# Patient Record
Sex: Female | Born: 2000 | Race: Black or African American | Hispanic: No | Marital: Single
Health system: Midwestern US, Community
[De-identification: ages and names within clinical notes are randomized; demographics above are authoritative.]

## PROBLEM LIST (undated history)

## (undated) DIAGNOSIS — Z3041 Encounter for surveillance of contraceptive pills: Secondary | ICD-10-CM

## (undated) DIAGNOSIS — R0602 Shortness of breath: Secondary | ICD-10-CM

## (undated) DIAGNOSIS — Z01419 Encounter for gynecological examination (general) (routine) without abnormal findings: Principal | ICD-10-CM

## (undated) HISTORY — PX: WISDOM TOOTH EXTRACTION: SHX21

---
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
## 2021-04-17 IMAGING — CR DG THORACIC SPINE 2V
3 series · 3 of 3 positions shown · non-contrast
Comparison: None.

CLINICAL DATA: Mid back pain

EXAM:
THORACIC SPINE 2 VIEWS

[t-spine ap]
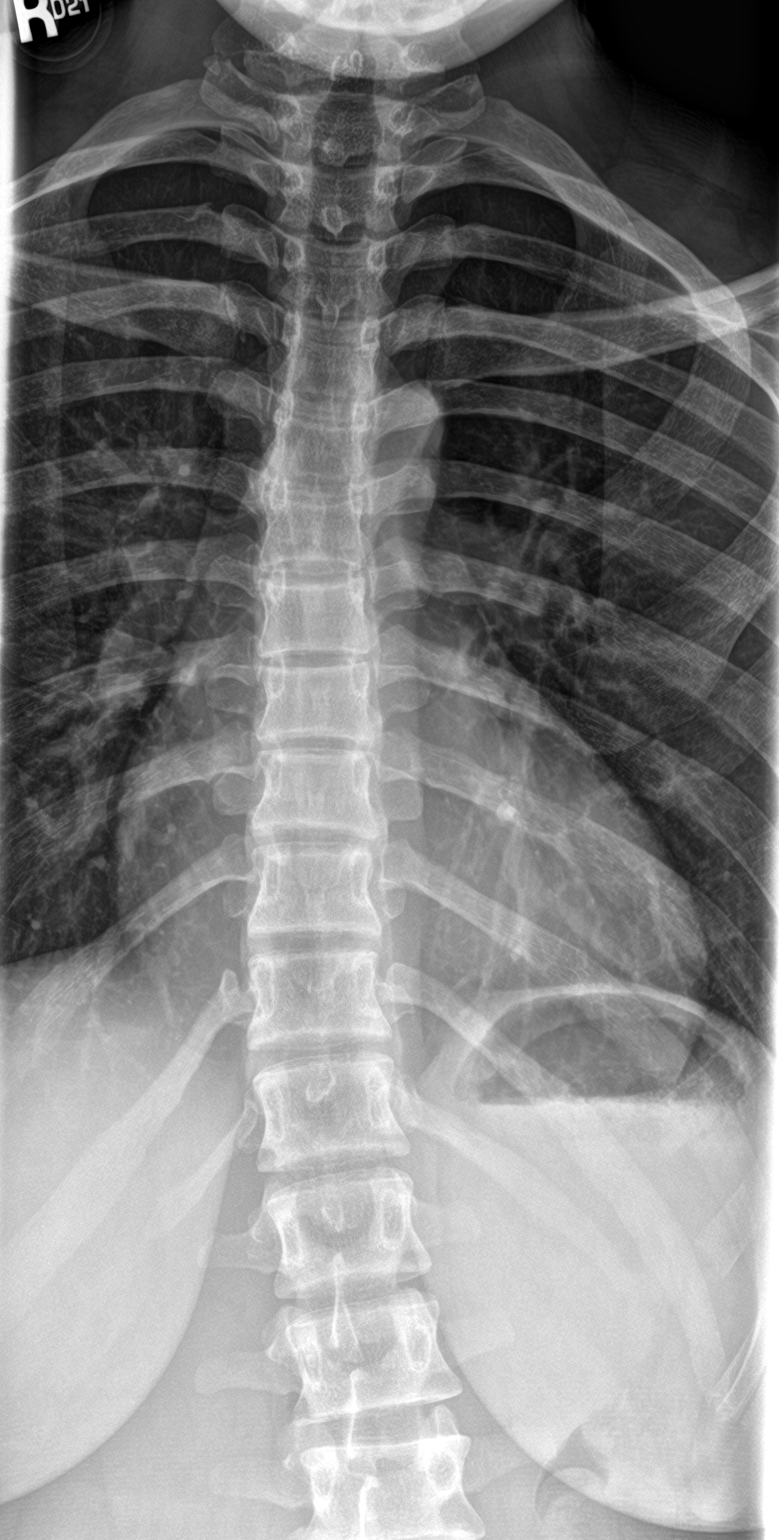

[t-spine lat]
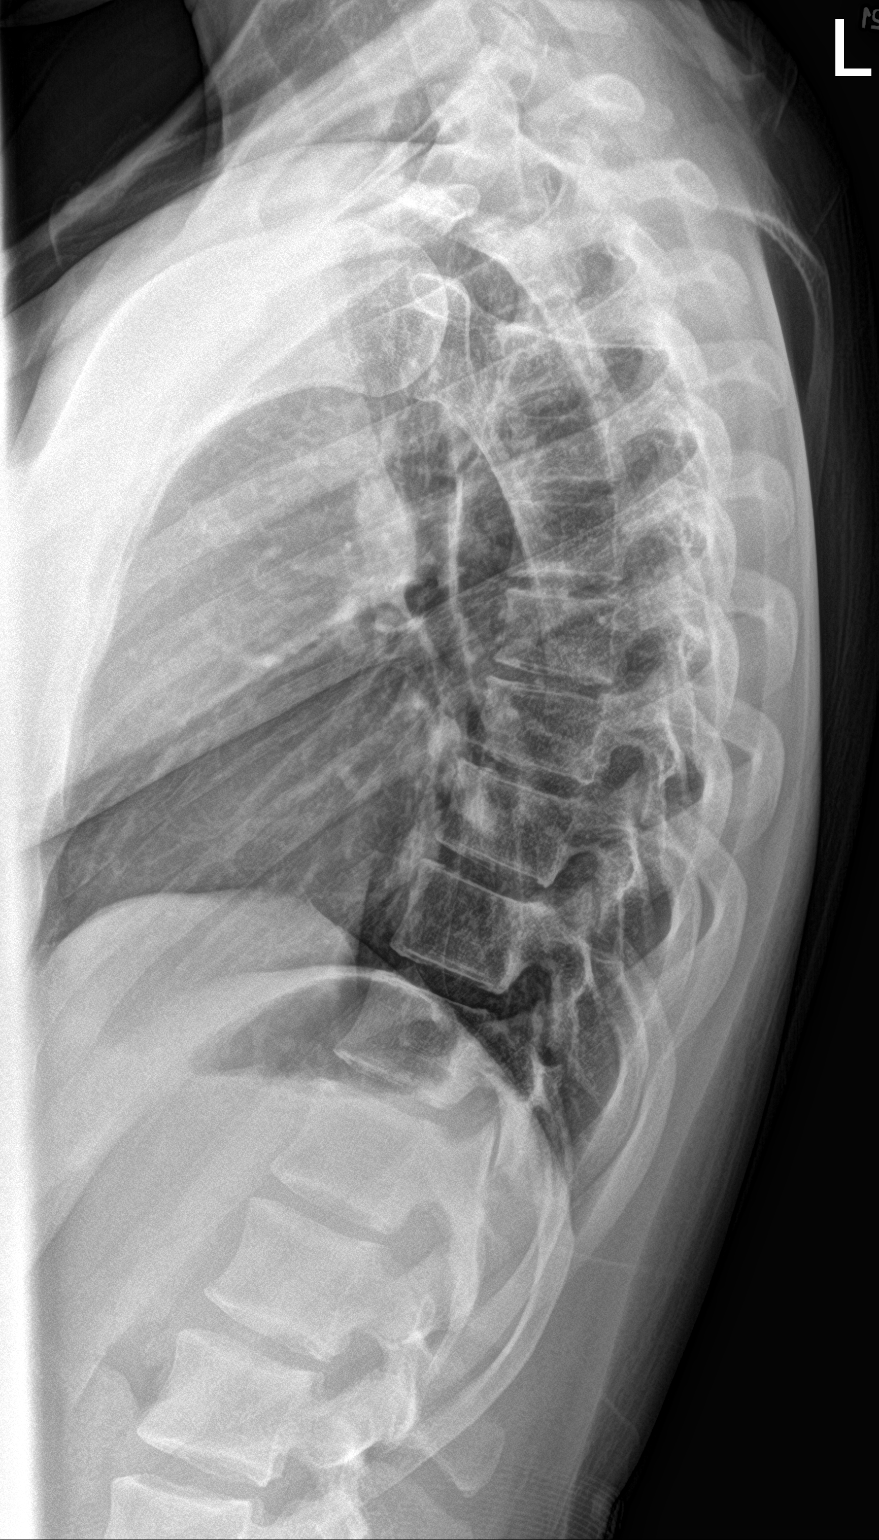

[t-spine swimmers]
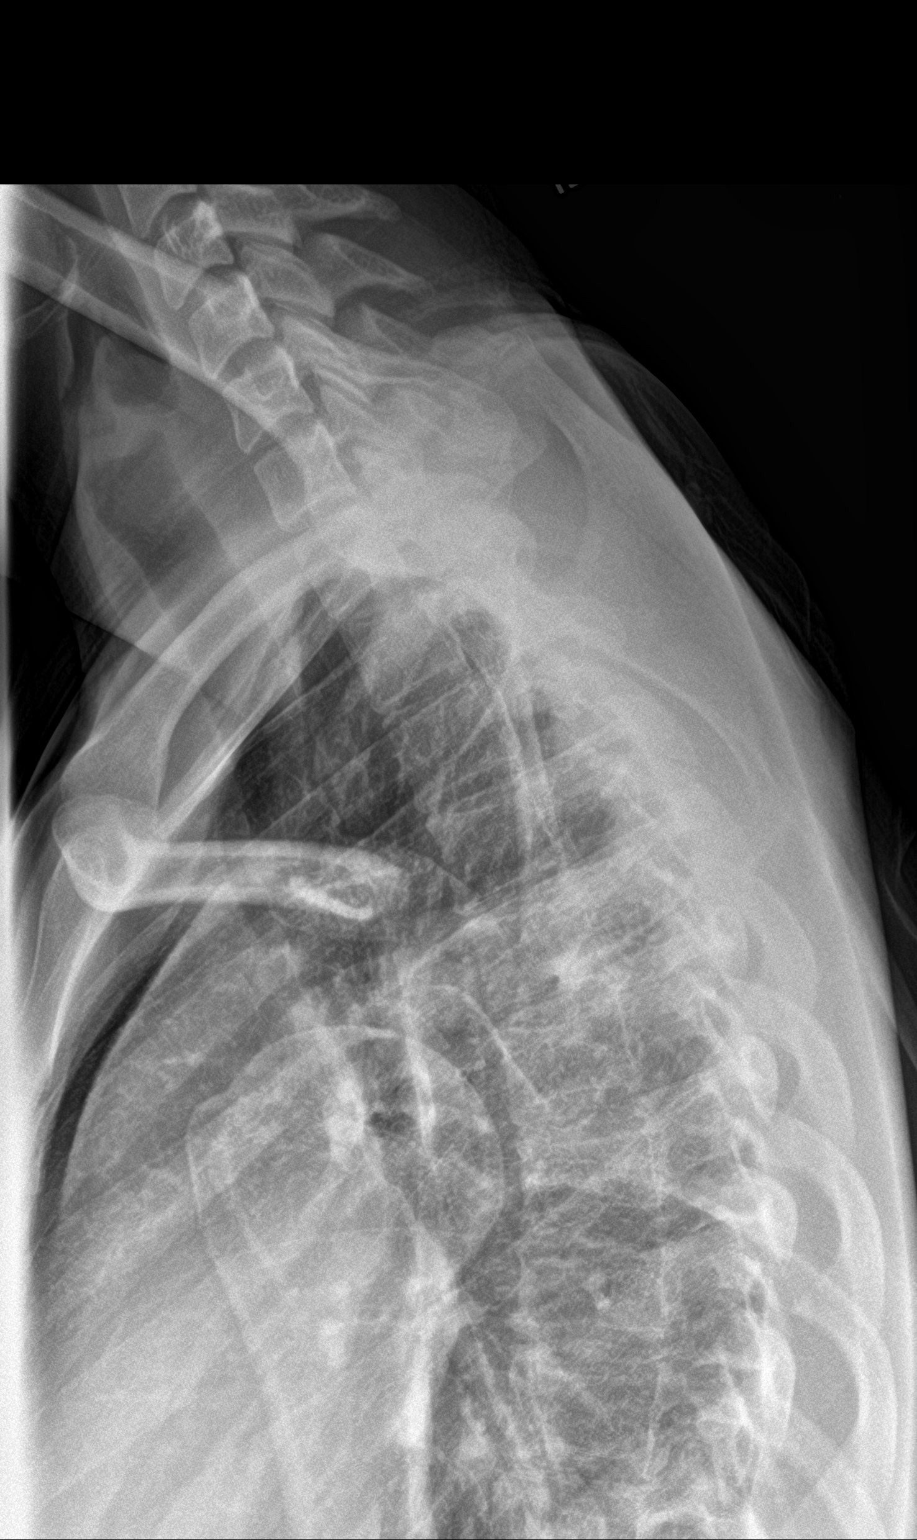

[3 of 3 positions shown; findings below may reference images not displayed]

FINDINGS: There is no evidence of thoracic spine fracture. Alignment is
normal. No other significant bone abnormalities are identified.
IMPRESSION: Negative.

## 2021-04-20 ENCOUNTER — Ambulatory Visit
Admit: 2021-04-20 | Discharge: 2021-04-20 | Payer: BLUE CROSS/BLUE SHIELD | Attending: Student in an Organized Health Care Education/Training Program | Primary: Diagnostic Radiology

## 2021-04-20 DIAGNOSIS — N760 Acute vaginitis: Secondary | ICD-10-CM

## 2021-04-20 NOTE — Progress Notes (Signed)
DATE OF VISIT:  04/20/21    PATIENT NAME:  Jacqueline Stark     DATE OF BIRTH: 2001-06-25    REASON FOR VISIT:    Chief Complaint   Patient presents with    Other     Sharp pain in pelvic area, recurring bv an yeast infection, and hungry every 5 min,. And did blood test at school pre -diabetes        HISTORY OF PRESENT ILLNESS:  Jacqueline Stark is a 20yo G0 presenting to discuss abnormal vaginal discharge and pelvic pain. She has been taking Junel COC for 3 weeks. It was prescribed for painful menses. Reports recurrent BV. Has been treated with po and gel metronidazole. Symptoms have improved with most recent course of po meds. Also endorses one episode of sharp pelvic pain earlier this week. It has since resolved.     Patient's last menstrual period was 03/25/2021.  No Known Allergies  Current Outpatient Medications   Medication Sig Dispense Refill    JUNEL FE 1/20 1-20 MG-MCG per tablet        No current facility-administered medications for this visit.     No past medical history on file.  No past surgical history on file.  Social History     Socioeconomic History    Marital status: Single   Tobacco Use    Smoking status: Never    Smokeless tobacco: Never     No family history on file.     REVIEW OF SYSTEMS:  Negative other than HPI      PHYSICAL EXAM:  BP 112/75    Ht 5\' 5"  (1.651 m)    Wt 146 lb (66.2 kg)    LMP 03/25/2021    BMI 24.30 kg/m??   Body mass index is 24.3 kg/m??.  General:  well developed, well nourished, in no acute distress     Head:   normocephalic and atraumatic     Lungs:  normal work of breathing on room air     Heart:   warm and well perfused     Abdomen:  abdomen soft and non-tender without masses, organomegaly, or hernias noted     Pelvic Exam:       External: normal female genitalia without lesions or masses       Vagina: white discharge, normal without lesions or masses       Cervix: normal without lesions or masses, no cervical motion tenerness       Adnexa: normal bimanual exam without masses or  fullness       Uterus: Normal size non tender    Extremities: no clubbing, cyanosis, edema, or deformity noted with normal full range of motion of all joints     Neurologic:  no focal deficits,normal coordination, muscle strength and tone     Skin:   intact without lesions or rashes     Psych:  alert and cooperative; normal mood and affect; normal attention span and concentration      ASSESSMENT/PLAN:    Nicholl was seen today for other.    Diagnoses and all orders for this visit:    Acute vaginitis  -     Nuswab Vaginitis Plus (VG+)    Screening examination for STD (sexually transmitted disease)  -     Nuswab Vaginitis Plus (VG+)     Vaginitis panel ordered to evaluate discharge. Patient reports improvement but not resolution of discharge with flagyl. Pelvic pain earlier this week consistent with ruptured ovarian cyst. Exam wnl  now. Recommend continuation of COC (started 3wks ago) to prevent this from occurring again and to treat painful menses.   Return in about 2 months (around 06/20/2021) for Follow up.   Flo Shanks, MD

## 2021-04-25 LAB — NUSWAB VAGINITIS PLUS (VG+)
Candida albicans, NAA: NEGATIVE
Candida glabrata: NEGATIVE
Chlamydia trachomatis, NAA: NEGATIVE
Neisseria Gonorrhoeae, NAA: NEGATIVE
Trichomonas Vaginalis by NAA: NEGATIVE

## 2021-07-04 ENCOUNTER — Ambulatory Visit
Admit: 2021-07-04 | Discharge: 2021-07-04 | Payer: BLUE CROSS/BLUE SHIELD | Attending: Student in an Organized Health Care Education/Training Program | Primary: Diagnostic Radiology

## 2021-07-04 DIAGNOSIS — Z3041 Encounter for surveillance of contraceptive pills: Secondary | ICD-10-CM

## 2021-07-04 MED ORDER — JUNEL FE 1/20 1-20 MG-MCG PO TABS
1-20- MG-MCG | PACK | Freq: Every day | ORAL | 3 refills | Status: AC
Start: 2021-07-04 — End: 2022-02-03

## 2021-07-04 NOTE — Progress Notes (Signed)
DATE OF VISIT:  07/04/21    PATIENT NAME:  Jacqueline Stark     DATE OF BIRTH: [birth year]    REASON FOR VISIT:    Chief Complaint   Patient presents with    Follow-up        HISTORY OF PRESENT ILLNESS:  Jacqueline Stark is 20 y.o. G0P0000 presenting for 2 month follow up after being prescribed Junel for painful menses. She reports her pain has greatly improved, and her cycle is regular and light.   She also complains of increased vaginal discharge and discomfort.     No LMP recorded.  No Known Allergies  Current Outpatient Medications   Medication Sig Dispense Refill    JUNEL FE 1/20 1-20 MG-MCG per tablet Take 1 tablet by mouth daily 3 packet 3     No current facility-administered medications for this visit.     History reviewed. No pertinent past medical history.  History reviewed. No pertinent surgical history.  Social History     Socioeconomic History    Marital status: Single     Spouse name: None    Number of children: None    Years of education: None    Highest education level: None   Tobacco Use    Smoking status: Never    Smokeless tobacco: Never     History reviewed. No pertinent family history.     REVIEW OF SYSTEMS:  Negative other than HPI      PHYSICAL EXAM:  BP 115/79    Ht 5\' 5"  (1.651 m)    Wt 151 lb (68.5 kg)    BMI 25.13 kg/m??   Body mass index is 25.13 kg/m??.  General:  well developed, well nourished, in no acute distress     Head:   normocephalic and atraumatic     Lungs:  normal work of breathing on room air     Heart:   warm and well perfused     Abdomen:  abdomen soft and non-tender without masses, organomegaly, or hernias noted     Pelvic Exam:       External: normal female genitalia without lesions or masses       Vagina: normal without lesions or masses       Cervix: normal without lesions or masses, no cervical motion tenerness       Adnexa: normal bimanual exam without masses or fullness       Uterus: Normal size non tender    Extremities: no clubbing, cyanosis, edema, or deformity noted with  normal full range of motion of all joints     Neurologic:  no focal deficits,normal coordination, muscle strength and tone     Skin:   intact without lesions or rashes     Psych:  alert and cooperative; normal mood and affect; normal attention span and concentration      ASSESSMENT/PLAN:    Jacqueline Stark was seen today for follow-up.    Diagnoses and all orders for this visit:    Encounter for surveillance of contraceptive pills  COC prescribed for one year.   -     JUNEL FE 1/20 1-20 MG-MCG per tablet; Take 1 tablet by mouth daily    Acute vaginitis  Vaginitis panel sent for discomfort. Will treat if indicated.   -     Nuswab Vaginitis with Candida (Six Species)       Return in about 1 year (around 07/04/2022) for Annual Exam.    07/06/2022, MD

## 2021-07-07 LAB — NUSWAB VAGINITIS WITH CANDIDA (SIX SPECIES)
Candida Lusitaniae, NAA: NEGATIVE
Candida Parapsilosis/Tropicalis: NEGATIVE
Candida albicans, NAA: NEGATIVE
Candida glabrata: NEGATIVE
Candida krusei: NEGATIVE
Trichomonas Vaginalis by NAA: NEGATIVE

## 2022-02-03 ENCOUNTER — Encounter

## 2022-02-04 MED ORDER — JUNEL FE 1/20 1-20 MG-MCG PO TABS
1-20- MG-MCG | PACK | Freq: Every day | ORAL | 3 refills | Status: DC
Start: 2022-02-04 — End: 2022-02-21

## 2022-02-19 NOTE — Telephone Encounter (Signed)
JUNEL FE 1/20 1-20 MG-MCG per tablet    Patient stated that she received notification from [place] that this rx was approved/refilled, but never received correspondence from her pharmacy for pickup. After further review it seems the pharmacy destination wasn't ever selected. Please assist.     Pharmacy verified   Aware of turn around time

## 2022-02-21 ENCOUNTER — Telehealth

## 2022-02-21 MED ORDER — JUNEL FE 1/20 1-20 MG-MCG PO TABS
1-20 MG-MCG | PACK | Freq: Every day | ORAL | 3 refills | Status: DC
Start: 2022-02-21 — End: 2022-07-19

## 2022-02-21 NOTE — Telephone Encounter (Signed)
Pt called to check to see if her RX for birth control was sent in.

## 2022-02-21 NOTE — Telephone Encounter (Signed)
Pt called for update on prescription refill.  Informed it was sent to [facility] in [facility], today at [employer]

## 2022-02-21 NOTE — Telephone Encounter (Signed)
Script resent

## 2022-07-19 ENCOUNTER — Encounter
Admit: 2022-07-19 | Discharge: 2022-07-19 | Payer: MEDICAID | Attending: Student in an Organized Health Care Education/Training Program | Primary: Diagnostic Radiology

## 2022-07-19 DIAGNOSIS — Z01419 Encounter for gynecological examination (general) (routine) without abnormal findings: Secondary | ICD-10-CM

## 2022-07-19 MED ORDER — JUNEL FE 1/20 1-20 MG-MCG PO TABS
1-20 MG-MCG | PACK | Freq: Every day | ORAL | 3 refills | Status: AC
Start: 2022-07-19 — End: 2022-12-30

## 2022-07-19 NOTE — Progress Notes (Signed)
Annual GYN Exam    Jacqueline Stark is a [place], 22 y.o. female who presents today for her annual gyn exam. She is doing well on [place] and requests refill.   She is in college studying Biology!     GynHx:  Patient's last menstrual period was 06/30/2022.   Regular monthly menses   Contraception: [place] Maintenance:  Gardasil Vaccine unsure    Review of Systems   Constitutional:  Negative for fatigue and fever.   Respiratory:  Negative for cough.    Cardiovascular:  Negative for chest pain.   Gastrointestinal:  Negative for nausea and vomiting.   Genitourinary:  Negative for dysuria and vaginal discharge.   Skin:  Negative for rash.   Neurological:  Negative for headaches.   Psychiatric: Negative for depression/anxiety.     Social History:   Tobacco Use: Low Risk  (07/19/2022)    Patient History     Smoking Tobacco Use: Never     Smokeless Tobacco Use: Never     Passive Exposure: Not on file     Social History     Substance and Sexual Activity   Alcohol Use Not Currently     Social History     Substance and Sexual Activity   Drug Use Not on file         OB History       Gravida   0    Para   0    Term   0    Preterm   0    AB   0    Living   0         SAB   0    IAB   0    Ectopic   0    Molar   0    Multiple   0    Live Births   0              History reviewed. No pertinent surgical history.   History reviewed. No pertinent past medical history.      Prior to Admission medications    Medication Sig Start Date End Date Taking? Authorizing Provider   JUNEL FE 1/20 1-20 MG-MCG per tablet Take 1 tablet by mouth daily 07/19/22  Yes Dulcie Gammon, Fabio Asa, MD     No Known Allergies  History reviewed. No pertinent family history.    General Examination:  Vitals:    07/19/22 1127   BP: 127/64   Pulse: 95   SpO2: 100%       GENERAL APPEARANCE: pleasant, well nourished, well developed, in no acute distress, female.   HEAD: normocephalic, atraumatic.   NECK: neck supple, full range of motion, no cervical lymphadenopathy, no  thyromegaly.   HEART: normal rate, warm and well perfused   LUNGS: normal work of breathing on room air  BREASTS: normal, no masses palpable bilaterally, no discharge, no dimpling no axillary lymphadenopathy.   ABDOMEN: soft, nontender, nondistended, no masses palpable.   SKIN: no suspicious lesions, warm and dry.   PSYCH: alert, oriented.   Gynecological:  EXTERNAL GENITALIA: introitus normal, no lesions.   URETHRA: normal.   VAGINA: pink mucosa without any lesions, normal discharge.   CERVIX: normal appearing cervix, no cervical movement tenderness, no lesions or discharge or bleeding.   UTERUS: normal mobility, nontender, normal size.   ADNEXA: no mass, nontender.      Assessment and Plan:      1. Well  woman exam with routine gynecological exam  2. Screening examination for STD (sexually transmitted disease)  -     Chlamydia, Gonorrhea, Trichomoniasis  3. Encounter for surveillance of contraceptive pills  -     JUNEL FE 1/20 1-20 MG-MCG per tablet; Take 1 tablet by mouth daily, Disp-3 packet, R-3, DAWNormal       Routine gynecologic examination  Her current medical status is satisfactory with no evidence of significant gynecologic issues.  Discussed ASCCP guidelines. Pap smear at age 65  STI Screening today  Counseled re: diet, exercise, healthy lifestyle  Return for yearly wellness visits    Return in about 1 year (around 07/20/2023) for Annual Exam.     Pearson Forster, MD

## 2022-07-20 LAB — CHLAMYDIA, GONORRHEA, TRICHOMONIASIS
Chlamydia trachomatis, NAA: NEGATIVE
Neisseria Gonorrhoeae, NAA: NEGATIVE
TRICHOMONAS VAGINALIS: NEGATIVE

## 2022-10-28 ENCOUNTER — Encounter: Payer: Self-pay | Admitting: *Deleted

## 2022-12-29 ENCOUNTER — Encounter

## 2022-12-30 MED ORDER — JUNEL FE 1/20 1-20 MG-MCG PO TABS
1-20 | PACK | Freq: Every day | ORAL | 3 refills | Status: AC
Start: 2022-12-30 — End: ?

## 2022-12-30 NOTE — Telephone Encounter (Signed)
From: Darlyn Read  To: Dr. Flo Shanks  Sent: 12/29/2022 9:45 PM EDT  Subject: Different Pharmacy     Hello Dr. Alen Bleacher,    I wanted to know if you would be able to change the store in which I get refills from. I would like to pick it up from the [facility] on [address]. I changed the location in the pharmacy settings but the location didn't update when I ask for a refill from you.

## 2023-02-04 ENCOUNTER — Inpatient Hospital Stay
Admit: 2023-02-04 | Discharge: 2023-02-05 | Disposition: A | Discharge status: Home / Self Care | Payer: BLUE CROSS/BLUE SHIELD

## 2023-02-04 ENCOUNTER — Emergency Department: Admit: 2023-02-04 | Payer: BLUE CROSS/BLUE SHIELD | Primary: Diagnostic Radiology

## 2023-02-04 DIAGNOSIS — R0602 Shortness of breath: Secondary | ICD-10-CM

## 2023-02-04 NOTE — ED Provider Notes (Signed)
[facility] EMERGENCY DEPT  EMERGENCY DEPARTMENT ENCOUNTER      Pt Name: Jacqueline Stark  MRN: [id number]  Birthdate Oct 16, 2000  Date of evaluation: 02/04/2023  Provider: Loraine Grip, DO    CHIEF COMPLAINT       Chief Complaint   Patient presents with    Shortness of Breath     Sob for a few weeks, pt sateses "it takes more effort to breath" pt ambulatory and talking in triage     HISTORY OF PRESENT ILLNESS    Jacqueline Stark is a 22 y.o. female who presents to the emergency department for shortness of breath.  Symptoms seem to start a month ago after she was resolved with a upper respiratory illness.  She had a sore throat and some congestion then.  Ever since this resolved she felt like she has been short of breath.  She experiences it when she is exercising and also talking.  Her exercise tolerance has been the same.  She works out regularly.  She denies cough, fevers, chest pain, pleuritic chest pain, vomiting, diarrhea, lower extremity swelling or pain.  She is on daily birth control.    Patient denies any history of DVT/PE, lower extremity swelling/pain, recent trauma/surgery/immobilization/travel, recent or current malignancy, hemoptysis, or tobacco use.      HPI  Nursing notes and current vital signs were reviewed.  REVIEW OF SYSTEMS     Review of Systems   Constitutional:  Negative for activity change, appetite change and fever.   HENT:  Negative for congestion, rhinorrhea and sore throat.    Respiratory:  Positive for shortness of breath. Negative for cough.    Cardiovascular:  Negative for chest pain, palpitations and leg swelling.   Gastrointestinal:  Negative for abdominal pain, diarrhea, nausea and vomiting.   Genitourinary:  Negative for dysuria.   Neurological:  Negative for headaches.   Psychiatric/Behavioral:  Negative for agitation.      Except as noted above the remainder of the review of systems was reviewed and negative.   PAST MEDICAL HISTORY   No past medical history on file.  SURGICAL HISTORY     No  past surgical history on file.  CURRENT MEDICATIONS       Previous Medications    JUNEL FE 1/20 1-20 MG-MCG PER TABLET    Take 1 tablet by mouth daily     ALLERGIES     Patient has no known allergies.    FAMILY HISTORY     No family history on file.  SOCIAL HISTORY       Social History     Socioeconomic History    Marital status: Single   Tobacco Use    Smoking status: Never    Smokeless tobacco: Never   Substance and Sexual Activity    Alcohol use: Not Currently     SCREENINGS       Glasgow Coma Scale  Eye Opening: Spontaneous  Best Verbal Response: Oriented  Best Motor Response: Obeys commands  Glasgow Coma Scale Score: 15             CIWA Assessment  BP: 117/76  Pulse: 72             PHYSICAL EXAM       ED Triage Vitals   BP Temp Temp Source Pulse Respirations SpO2 Height Weight - Scale   02/04/23 1916 02/04/23 1916 02/04/23 1916 02/04/23 1916 02/04/23 1916 02/04/23 1916 02/04/23 1915 02/04/23 1915   117/76 99.1 F (37.3 C)  Oral 72 18 100 % 1.651 m (5\' 5" ) 67.1 kg (148 lb)     Physical Exam  Vitals and nursing note reviewed.   Constitutional:       General: She is not in acute distress.     Appearance: Normal appearance. She is well-developed. She is not ill-appearing or toxic-appearing.   HENT:      Head: Normocephalic and atraumatic.      Nose: Nose normal.      Mouth/Throat:      Mouth: Mucous membranes are moist.   Eyes:      Extraocular Movements: Extraocular movements intact.      Pupils: Pupils are equal, round, and reactive to light.   Cardiovascular:      Rate and Rhythm: Normal rate and regular rhythm.      Pulses: Normal pulses.   Pulmonary:      Effort: Pulmonary effort is normal. No tachypnea, accessory muscle usage or respiratory distress.      Breath sounds: Normal breath sounds. No decreased breath sounds, wheezing, rhonchi or rales.   Abdominal:      General: Abdomen is flat. Bowel sounds are normal.      Palpations: Abdomen is soft.   Musculoskeletal:         General: Normal range of motion.       Right lower leg: No tenderness. No edema.      Left lower leg: No tenderness. No edema.   Skin:     General: Skin is warm and dry.   Neurological:      General: No focal deficit present.      Mental Status: She is alert and oriented to person, place, and time. Mental status is at baseline.   Psychiatric:         Mood and Affect: Mood normal.       DIAGNOSTIC RESULTS   RADIOLOGY:   Interpretation per the Radiologist below, if available at the time of this note:  XR CHEST (2 VW)   Final Result      No evidence of acute cardiopulmonary disease.        Plain radiographic image interpretations visualized and primarily interpreted by myself are located in the ED Course section below.    LABS:  Labs Reviewed   COVID-19 & INFLUENZA COMBO [facility])    Narrative:     Is this test for diagnosis or screening?->Diagnosis of ill patient  Symptomatic for COVID-19 as defined by CDC?->Yes  Date of Symptom Onset->02/04/23  Hospitalized for COVID-19?->No  Admitted to ICU for COVID-19?->No   POC PREGNANCY UR-QUAL   POC PREGNANCY UR-QUAL     All other labs were within normal range or not returned as of this dictation.    PROCEDURES:  Unless otherwise noted below, none  Procedures    EMERGENCY DEPARTMENT MEDICATIONS:  Medications - No data to display    EMERGENCY DEPARTMENT COURSE and DIFFERENTIAL DIAGNOSIS/MDM:   Vitals:    Vitals:    02/04/23 1915 02/04/23 1916   BP:  117/76   Pulse:  72   Resp:  18   Temp:  99.1 F (37.3 C)   TempSrc:  Oral   SpO2:  100%   Weight: 67.1 kg (148 lb)    Height: 1.651 m (5\' 5" )      Medical Decision Making  Amount and/or Complexity of Data Reviewed  Labs: ordered.  Radiology: ordered. Decision-making details documented in ED Course.  ECG/medicine tests: ordered. Decision-making details documented in ED  Course.      Workup today is reassuring.  Patient has normal vitals and physical exam.  Unclear etiology of her shortness of breath.  It sounds like she may have had COVID or another URI a month  ago.  This could be contributory.  Close PCP follow-up recommended and symptomatic care at home.    Additional Documentation:  Diagnostic testing, treatment, consultation, procedure, and/or hospitalization considered but not initiated*: D-dimer considered.  However I have very low suspicion for pulmonary embolism.    History obtained by*: Patient    Discussion of management or test interpretation with external physician/other appropriate source*: N/A    Prior external note(s) reviewed*: prior subspecialty note(s): OB/GYN    Social determinants of health that significantly affect care*: N/A    *If not otherwise stated above in HPI, MDM, or other parts of ED note.    REASSESSMENT/ED COURSE NOTES     ED Course as of 02/04/23 2021   Tue Feb 04, 2023   1955 EKG 12 Lead (SOB)  EKG Interpretation:    EKG shows a ventricular rate of 60, sinus rhythm with respiratory variation, no ST segment abnormalities, normal axis, normal intervals, unchanged morphology from prior ECG.    EKG was reviewed and interpreted by myself in the absence of a cardiologist.   [MF]   1956 XR CHEST (2 VW)  No acute cardiopulmonary process appreciated.    Plain film visualized and interpreted by myself in the absence of a radiologist.    [MF]      ED Course User Index  [MF] Loraine Grip, DO     FINAL IMPRESSION      1. Shortness of breath        DISPOSITION/PLAN     DISPOSITION Decision To Discharge 02/04/2023 08:19:59 PM    Patient is stable for discharge.  Patient and/or caregiver agrees with the plan and understands the plan.  Vital signs stable at discharge.  Return precautions given. Discharge paperwork discussed with the patient and/or caregiver.     PATIENT REFERRED TO:  Primary Care Physician  If you do not have a primary care physician call [phone] to obtain an appointment with one.  Schedule an appointment as soon as possible for a visit in 3 days      [facility] EMERGENCY DEPT  [address]  [place]  [address]  [phone]  Go to   If symptoms worsen, As needed    DISCHARGE MEDICATIONS:  New, changed, or discontinued by provider or reported by the patient.  New Prescriptions    No medications on file     Modified Medications    No medications on file     Discontinued Medications    No medications on file      Controlled Substances Monitoring:        No data to display                Please note that portions of this note were completed with a voice recognition program.  Efforts were made to edit the dictations but occasionally words are mis-transcribed. This encounter may have been completed utilizing Direct Speech Voice Recognition Software. Grammatical errors, random word insertions, pronoun errors, and incomplete sentences are occasional consequences of the system due to software limitation, ambient noise, and hardware issues. These may be missed by proof reading prior to affixing electronic signature. Any questions or concerns about the content, text, or information contained within the body of  this dictation should be directly addressed to the physician for clarification. If you have any questions or concerns please do not hesitate to contact me.     Loraine Grip, DO (electronically signed)  Attending Emergency Physician             Loraine Grip, DO  02/04/23 2021

## 2023-02-05 LAB — POC PREGNANCY UR-QUAL: Preg Test, Ur: NEGATIVE

## 2023-02-05 LAB — COVID-19 & INFLUENZA COMBO (LIAT HOSPITAL)
Influenza A: NOT DETECTED
Influenza B: NOT DETECTED
SARS-CoV-2: NOT DETECTED

## 2023-02-06 LAB — EKG 12-LEAD
P Axis: -4 degrees
P-R Interval: 148 ms
Q-T Interval: 388 ms
QRS Duration: 84 ms
QTc Calculation (Bazett): 388 ms
R Axis: 88 degrees
T Axis: 54 degrees
Ventricular Rate: 60 {beats}/min

## 2023-07-21 ENCOUNTER — Encounter
Admit: 2023-07-21 | Payer: MEDICAID | Admitting: Student in an Organized Health Care Education/Training Program | Primary: Diagnostic Radiology

## 2023-07-21 VITALS — BP 122/82 | HR 85 | Wt 156.0 lb

## 2023-07-21 DIAGNOSIS — Z01419 Encounter for gynecological examination (general) (routine) without abnormal findings: Principal | ICD-10-CM

## 2023-07-21 MED ORDER — NORETHIN ACE-ETH ESTRAD-FE 1.5-30 MG-MCG PO TABS
1.5-30 | PACK | Freq: Every day | ORAL | 3 refills | Status: DC
Start: 2023-07-21 — End: 2024-07-21

## 2023-07-21 NOTE — Progress Notes (Signed)
 Annual GYN Exam    Jacqueline Stark is a G0P0000, 23 y.o. female who presents today for her annual gyn exam.   She is doing well today. Starting her last semester of college and plans to go to [employer]!  She endorses some break though cramping with Junel  1/20.         07/21/2023     8:56 AM   PHQ-9    Little interest or pleasure in doing things 0   Feeling down, depressed, or hopeless 0   PHQ-2 Score 0   PHQ-9 Total Score 0        GynHx:  Patient's last menstrual period was 06/23/2023 (approximate).   Regular monthly menses on Junel  1/20.   Sexually active: yes with men   Contraception: COC  Issues with intercourse: one episode of sharp dyspareunia   STD history: Chlamydia in 2022     Health Maintenance:  First pap smear today.   Gardasil Vaccine No  Flu Shot No     Review of Systems   Constitutional:  Negative for fatigue and fever.   Respiratory:  Negative for cough.    Cardiovascular:  Negative for chest pain.   Gastrointestinal:  Negative for nausea and vomiting.   Genitourinary:  Negative for dysuria and vaginal discharge.   Skin:  Negative for rash.   Neurological:  Negative for headaches.   Psychiatric: Negative for depression/anxiety.     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   Tobacco Use: Low Risk  (07/21/2023)    Patient History     Smoking Tobacco Use: Never     Smokeless Tobacco Use: Never     Passive Exposure: Not on file     Social History     Substance and Sexual Activity   Alcohol Use Not Currently     Social History     Substance and Sexual Activity   Drug Use Not on file         OB History       Gravida   0    Para   0    Term   0    Preterm   0    AB   0    Living   0         SAB   0    IAB   0    Ectopic   0    Molar   0    Multiple   0    Live Births   0              History reviewed. No pertinent surgical history.   History reviewed. No pertinent past medical history.      Prior to Admission medications    Medication Sig Start Date End Date Taking?  Authorizing Provider   norethindrone-ethinyl estradiol-iron (LOESTRIN  FE 1.5/30) 1.5-30 MG-MCG tablet Take 1 tablet by mouth daily 07/21/23  Yes Kenyonna Micek, Damien HERO, MD     No Known Allergies  History reviewed. No pertinent family history.    General Examination:  Vitals:    07/21/23 0854   BP: 122/82   Pulse: 85   SpO2: 98%       GENERAL APPEARANCE: pleasant, well nourished, well developed, in no acute distress, female.   HEAD: normocephalic, atraumatic.   NECK: neck supple, full range of motion, no cervical lymphadenopathy, no thyromegaly.   HEART: normal rate, warm and well perfused   LUNGS:  normal work of breathing on room air  BREASTS: normal, no masses palpable bilaterally, no discharge, no dimpling no axillary lymphadenopathy.   ABDOMEN: soft, nontender, nondistended, no masses palpable.   SKIN: no suspicious lesions, warm and dry.   PSYCH: alert, oriented.   Gynecological:  EXTERNAL GENITALIA: introitus normal, no lesions.   URETHRA: normal.   VAGINA: pink mucosa without any lesions, normal discharge.   CERVIX: normal appearing cervix, no cervical movement tenderness, no lesions or discharge or bleeding.   UTERUS: normal mobility, nontender, normal size.   ADNEXA: no mass, nontender.      Assessment and Plan:    1. Well woman exam with routine gynecological exam  2. Encounter for screening for cervical cancer  -     PAP IG, CT-NG-TV, rfx Aptima HPV ASCUS ([id number])  3. Screening examination for STD (sexually transmitted disease)  -     PAP IG, CT-NG-TV, rfx Aptima HPV ASCUS ([id number])  4. Surveillance for birth control, oral contraceptives  -     norethindrone-ethinyl estradiol-iron (LOESTRIN  FE 1.5/30) 1.5-30 MG-MCG tablet; Take 1 tablet by mouth daily, Disp-3 packet, R-3Normal  5. Dysmenorrhea  -     norethindrone-ethinyl estradiol-iron (LOESTRIN  FE 1.5/30) 1.5-30 MG-MCG tablet; Take 1 tablet by mouth daily, Disp-3 packet, R-3Normal       Routine gynecologic examination  Her current medical status is satisfactory  with no evidence of significant gynecologic issues.  Discussed ASCCP guidelines. First pap smear today.   STI Screening today   Increased COC dose to 1.5/20 for dysmenorrhea. She will call with any problems with medication.   Counseled re: diet, exercise, healthy lifestyle  Return for yearly wellness visits    Return in about 1 year (around 07/20/2024) for Annual Exam.     Damien CHRISTELLA Flight, MD

## 2023-07-23 NOTE — Telephone Encounter (Signed)
Patient notified

## 2023-07-23 NOTE — Telephone Encounter (Signed)
-----   Message from Dr. Juanita Laster, MD sent at 07/21/2023  9:05 AM EST -----  Please authorize for HPV vaccine. Thanks!

## 2023-07-23 NOTE — Telephone Encounter (Addendum)
*  Gardasil is a Non Covered Benefit, due to patient Family Planning Medicaid and is considered a benefit exclusion w/ no option for review.  *Patient will be 100% responsible for ALL charges due at the time of service. 1st Gardasil Inj. - $590.20,   2nd Gardasil Inj. - $563.68 & 3rd Gardasil Inj. - $027.25 for each shot

## 2023-07-31 LAB — PAP IG, CT-NG-TV, RFX APTIMA HPV ASCUS (199325)
.: 0
Chlamydia trachomatis, NAA: NEGATIVE
Neisseria Gonorrhoeae, NAA: NEGATIVE
Trichomonas Vaginalis by NAA: NEGATIVE

## 2023-08-01 NOTE — Telephone Encounter (Signed)
Pt called to schedule Colposcopy appt

## 2023-08-05 NOTE — Telephone Encounter (Signed)
Pt called in to schedule Colposcopy  with Dr. Alen Bleacher at [facility]. Please advise.

## 2023-08-11 NOTE — Telephone Encounter (Signed)
Contacted patient, mailbox full.

## 2023-08-12 NOTE — Telephone Encounter (Signed)
Pt calling to schedule her colpo. Please assist w/ scheduling.

## 2023-08-13 NOTE — Telephone Encounter (Signed)
Pt returning Angel's call to schedule Colpo

## 2023-08-13 NOTE — Telephone Encounter (Signed)
Patient stated she will call back to schedule appt. She's goes to school out of state.

## 2023-08-26 ENCOUNTER — Inpatient Hospital Stay: Admit: 2023-08-26 | Discharge: 2023-08-26 | Payer: MEDICAID | Primary: Diagnostic Radiology

## 2023-08-26 ENCOUNTER — Ambulatory Visit
Admit: 2023-08-26 | Discharge: 2023-08-26 | Payer: BLUE CROSS/BLUE SHIELD | Attending: Student in an Organized Health Care Education/Training Program | Primary: Diagnostic Radiology

## 2023-08-26 ENCOUNTER — Ambulatory Visit: Payer: MEDICAID | Primary: Diagnostic Radiology

## 2023-08-26 VITALS — BP 110/70 | HR 90 | Wt 152.0 lb

## 2023-08-26 DIAGNOSIS — N946 Dysmenorrhea, unspecified: Secondary | ICD-10-CM

## 2023-08-26 DIAGNOSIS — R87612 Low grade squamous intraepithelial lesion on cytologic smear of cervix (LGSIL): Secondary | ICD-10-CM

## 2023-08-26 LAB — CBC WITH AUTO DIFFERENTIAL
Basophils %: 0.4 % (ref 0.0–2.0)
Basophils Absolute: 0 10*3/uL (ref 0.0–0.2)
Eosinophils %: 0.4 % (ref 0.0–7.0)
Eosinophils Absolute: 0 10*3/uL (ref 0.0–0.5)
Hematocrit: 38.2 % (ref 34.0–47.0)
Hemoglobin: 12.2 g/dL (ref 11.5–15.7)
Immature Grans (Abs): 0.01 10*3/uL (ref 0.00–0.06)
Immature Granulocytes %: 0.2 % (ref 0.0–0.6)
Lymphocytes Absolute: 2 10*3/uL (ref 1.0–3.2)
Lymphocytes: 44.6 % (ref 15.0–45.0)
MCH: 26.9 pg — ABNORMAL LOW (ref 27.0–34.5)
MCHC: 31.9 g/dL (ref 30.0–36.0)
MCV: 84.1 fL (ref 81.0–99.0)
MPV: 9 fL (ref 7.0–12.2)
Monocytes %: 7 % (ref 4.0–12.0)
Monocytes Absolute: 0.3 10*3/uL (ref 0.3–1.0)
NRBC Absolute: 0 10*3/uL (ref 0.000–0.012)
NRBC Automated: 0 % (ref 0.0–0.2)
Neutrophils %: 47.4 % (ref 42.0–74.0)
Neutrophils Absolute: 2.2 10*3/uL (ref 1.6–7.3)
Platelets: 357 10*3/uL (ref 140–440)
RBC: 4.54 x10e6/mcL (ref 3.60–5.20)
RDW: 12.4 % (ref 10.0–17.0)
WBC: 4.6 10*3/uL (ref 3.8–10.6)

## 2023-08-26 LAB — VITAMIN D 25 HYDROXY: Vit D, 25-Hydroxy: 14.9 ng/mL — ABNORMAL LOW (ref 30.0–90.0)

## 2023-08-26 LAB — AMB POC URINE PREGNANCY TEST, VISUAL COLOR COMPARISON: HCG, Pregnancy, Urine, POC: NEGATIVE

## 2023-08-26 LAB — TSH REFLEX TO FT4: TSH: 1.45 u[IU]/mL (ref 0.358–3.740)

## 2023-08-26 LAB — COMPREHENSIVE METABOLIC PANEL
ALT: 10 U/L (ref 0–42)
AST: 14 U/L (ref 0–46)
Albumin/Globulin Ratio: 1.62 (ref 1.00–2.70)
Albumin: 4.2 g/dL (ref 3.5–5.2)
Alk Phosphatase: 40 U/L (ref 35–117)
Anion Gap: 9 mmol/L (ref 2–17)
BUN: 8 mg/dL (ref 6–20)
CO2: 26 mmol/L (ref 22–29)
Calcium: 9.3 mg/dL (ref 8.5–10.7)
Chloride: 104 mmol/L (ref 98–107)
Creatinine: 0.7 mg/dL (ref 0.5–1.0)
Est, Glom Filt Rate: 127 mL/min/1.73mÂ² (ref >=60)
Globulin: 2.6 g/dL (ref 1.9–4.4)
Glucose: 91 mg/dL (ref 70–99)
Osmolaliy Calculated: 276 mosm/kg (ref 270–287)
Potassium: 4.4 mmol/L (ref 3.5–5.3)
Sodium: 139 mmol/L (ref 135–145)
Total Bilirubin: 0.24 mg/dL (ref 0.00–1.20)
Total Protein: 6.8 g/dL (ref 5.7–8.3)

## 2023-08-26 LAB — VITAMIN B12: Vitamin B-12: 411 pg/mL (ref 232–1245)

## 2023-08-26 LAB — MAGNESIUM: Magnesium: 1.9 mg/dL (ref 1.6–2.6)

## 2023-08-26 LAB — FERRITIN: Ferritin: 30.7 ng/mL (ref 13.0–150.0)

## 2023-08-26 NOTE — Progress Notes (Signed)
Patient is a 23 y.o. G0P0000  here for colposcopy.  Cytology: LSIL  History of hysterectomy (including cervix)?:  no  Tobacco use: no  HIV/immunosuppressed: no     Risks of colposcopy discussed with patient.     Patient placed in lithotomy position.  Speculum is inserted into the vagina and cervix is visualized.  Diluted acetic acid   is applied, then the cervix was visualized utilizing the colposcope.     The findings were as follows:     Cervix visibility:  Fully Visible:  yes     SCJ visibility:  Fully visible: yes     Cervical manipulation required to completely visualize the SCJ?  no     Acetowhitening present?   at SCJ    Lesion(s) present?  no    At SCJ?:  no    Vaginal lesion(s) present?  No     Colposcopic Impression:  Low grade           Directed biopsies were not collected.    ECC was performed.  All instruments removed from the vagina. Patient tolerated procedure well.    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  All questions answered.  Recommended repeat Pap smear next year.  Recommended Gardasil vaccine series.  Informational pamphlet given about Gardasil vaccine.  She plans to receive vaccine at college in [place].  She will call if she changes her mind desires to have vaccine with our office.  -     AMB POC URINE PREGNANCY TEST, VISUAL COLOR COMPARISON  -     TISSUE PATHOLOGY GYN  2. Dysmenorrhea  Labs ordered to evaluate patient.  Will follow-up with her if any labs are abnormal.  Patient doing well on combined oral contraceptive.  -     CBC with Auto Differential; Future  -     Comprehensive Metabolic Panel; Future  -     Ferritin  -     Vitamin B12  -     Vitamin D 25 Hydroxy; Future  -     TSH reflex to FT4; Future  -     Magnesium; Future      Return in about 1 year (around 08/25/2024) for Annual Exam.    Flo Shanks, MD

## 2023-08-29 NOTE — Telephone Encounter (Signed)
Reviewed benign colposcopy biopsy and lab results over the phone. Discussed vitamin d supplementation and recommended [phone] IU per day of Vit D supplementation. She has 50,000 IU supplements that she is taking weekly. Follow up for repeat pap smear at annual exam.

## 2023-12-12 ENCOUNTER — Encounter

## 2024-07-21 ENCOUNTER — Ambulatory Visit
Admit: 2024-07-21 | Discharge: 2024-07-21 | Payer: BLUE CROSS/BLUE SHIELD | Attending: Student in an Organized Health Care Education/Training Program | Primary: Diagnostic Radiology

## 2024-07-21 ENCOUNTER — Encounter

## 2024-07-21 LAB — COMPREHENSIVE METABOLIC PANEL
ALT: 10 U/L (ref 0–42)
AST: 18 U/L (ref 0–46)
Albumin/Globulin Ratio: 1.8 (ref 1.00–2.70)
Albumin: 4.5 g/dL (ref 3.5–5.2)
Alk Phosphatase: 48 U/L (ref 35–117)
Anion Gap: 10 mmol/L (ref 2–17)
BUN: 8 mg/dL (ref 6–20)
CO2: 26 mmol/L (ref 22–29)
Calcium: 9.5 mg/dL (ref 8.5–10.7)
Chloride: 104 mmol/L (ref 98–107)
Creatinine: 0.7 mg/dL (ref 0.5–1.0)
Est, Glom Filt Rate: 125 mL/min/1.73m?? (ref >=60)
Globulin: 2.5 g/dL (ref 1.9–4.4)
Glucose: 90 mg/dL (ref 70–99)
Osmolaliy Calculated: 277 mosm/kg (ref 270–287)
Potassium: 4 mmol/L (ref 3.5–5.3)
Sodium: 140 mmol/L (ref 135–145)
Total Bilirubin: 0.34 mg/dL (ref 0.00–1.20)
Total Protein: 7 g/dL (ref 5.7–8.3)

## 2024-07-21 LAB — VITAMIN D 25 HYDROXY: Vit D, 25-Hydroxy: 16 ng/mL — ABNORMAL LOW (ref 30.0–90.0)

## 2024-07-21 LAB — TSH REFLEX TO FT4: TSH: 0.862 u[IU]/mL (ref 0.358–3.740)

## 2024-07-21 LAB — MAGNESIUM: Magnesium: 1.9 mg/dL (ref 1.6–2.6)

## 2024-07-21 LAB — CBC
Hematocrit: 34 % (ref 34.0–47.0)
Hemoglobin: 11.3 g/dL — ABNORMAL LOW (ref 11.5–15.7)
MCH: 27.2 pg (ref 27.0–34.5)
MCHC: 33.2 g/dL (ref 30.0–36.0)
MCV: 81.7 fL (ref 81.0–99.0)
MPV: 9.6 fL (ref 7.0–12.2)
NRBC Absolute: 0 x10e3/mcL (ref 0.00–0.01)
NRBC Automated: 0 % (ref 0.0–0.2)
Platelets: 350 x10e3/mcL (ref 140–440)
RBC: 4.16 x10e6/mcL (ref 3.60–5.20)
RDW: 12.8 % (ref 10.0–17.0)
WBC: 3.4 x10e3/mcL — ABNORMAL LOW (ref 3.8–10.6)

## 2024-07-21 LAB — IRON AND TIBC
Iron % Saturation: 24 % (ref 20–40)
Iron: 72 ug/dL (ref 37–145)
TIBC: 306 ug/dL (ref 250–450)
UIBC: 234 ug/dL (ref 112.0–347.0)

## 2024-07-21 LAB — FERRITIN: Ferritin: 22.3 ng/mL (ref 13.0–150.0)

## 2024-07-21 LAB — VITAMIN B12: Vitamin B-12: 461 pg/mL (ref 232–1245)

## 2024-07-21 NOTE — Progress Notes (Signed)
 Annual GYN Exam    Jacqueline Stark is a G0P0000, 24 y.o. female who presents today for her annual gyn exam.      Graduated and going to grad school for business.   She self discontinues COC due to dysmenorrhea and not currently sexually active. She does not desire contraception at this time.         07/18/2024    10:45 AM   PHQ-9    Little interest or pleasure in doing things 0   Feeling down, depressed, or hopeless 0   PHQ-2 Score 0    PHQ-9 Total Score 0        Patient-reported        GynHx:  Patient's last menstrual period was 07/07/2024.   Regular monthly menses   Sexually active: no  Contraception: none    Health Maintenance:  Last Pap result:   Diagnosis:   Date Value Ref Range Status   07/21/2023 Comment (A)  Final     Comment:     EPITHELIAL CELL ABNORMALITY.  LOW GRADE SQUAMOUS INTRAEPITHELIAL LESION (LSIL).       Specimen adequacy:   Date Value Ref Range Status   07/21/2023 Comment  Final     Comment:     Satisfactory for evaluation. Endocervical and/or squamous metaplastic  cells (endocervical component) are present.       Methodology:   Date Value Ref Range Status   07/21/2023 Comment  Final     Comment:     This liquid based ThinPrep(R) pap test was screened with the  use of an image guided system.         Review of Systems   Constitutional:  Negative for fatigue and fever.   Respiratory:  Negative for cough.    Cardiovascular:  Negative for chest pain.   Gastrointestinal:  Negative for nausea and vomiting.   Genitourinary:  Negative for dysuria and vaginal discharge.   Skin:  Negative for rash.   Neurological:  Negative for headaches.   Psychiatric: Negative for depression/anxiety.     Social History:   Feels safe at home. Going to [employer] for grad school   Tobacco Use: Low Risk  (07/21/2024)    Patient History     Smoking Tobacco Use: Never     Smokeless Tobacco Use: Never     Passive Exposure: Not on file     Social History     Substance and Sexual Activity   Alcohol Use Not Currently     Social History      Substance and Sexual Activity   Drug Use Not on file         OB History       Gravida   0    Para   0    Term   0    Preterm   0    AB   0    Living   0         SAB   0    IAB   0    Ectopic   0    Molar   0    Multiple   0    Live Births   0              History reviewed. No pertinent surgical history.   History reviewed. No pertinent past medical history.      Prior to Admission medications   Not on File     No Known Allergies  History reviewed. No pertinent family history.    General Examination:  Vitals:    07/21/24 0953   BP: 108/60   Pulse: 63   SpO2: 98%       GENERAL APPEARANCE: pleasant, well nourished, well developed, in no acute distress, female.   HEAD: normocephalic, atraumatic.   NECK: neck supple, full range of motion, no cervical lymphadenopathy, no thyromegaly.   HEART: normal rate, warm and well perfused   LUNGS: normal work of breathing on room air  BREASTS: normal, no masses palpable bilaterally, no discharge, no dimpling no axillary lymphadenopathy.   ABDOMEN: soft, nontender, nondistended, no masses palpable.   SKIN: no suspicious lesions, warm and dry.   PSYCH: alert, oriented.   Gynecological:  EXTERNAL GENITALIA: introitus normal, no lesions.   URETHRA: normal.   VAGINA: pink mucosa without any lesions, normal discharge.   CERVIX: normal appearing cervix, no cervical movement tenderness, no lesions or discharge or bleeding.   UTERUS: normal mobility, nontender, normal size.   ADNEXA: no mass, nontender.      Assessment and Plan:    1. Well woman exam with routine gynecological exam  -     CBC; Future  -     TSH reflex to FT4; Future  -     Comprehensive Metabolic Panel; Future  -     Vitamin B12; Future  -     Magnesium; Future  -     Vitamin D 25 Hydroxy; Future  -     Iron And TIBC; Future  -     Ferritin; Future  2. Encounter for screening for cervical cancer  -     PAP LB, Reflex HPV ASCUS ([id number])  ([facility] DEFAULT)       Routine gynecologic examination  Her current medical status  is satisfactory with no evidence of significant gynecologic issues.  Discussed ASCCP guidelines. Pap smear performed today. LSIL last year.   Counseled re: diet, exercise, healthy lifestyle  Return for yearly wellness visits    Return in about 1 year (around 07/21/2025) for Annual Exam.     Damien CHRISTELLA Flight, MD

## 2024-07-22 LAB — PAP LB, REFLEX HPV ASCUS (199300): .: 0
